# Patient Record
Sex: Female | Born: 1975 | Race: Black or African American | Hispanic: No | Smoking: Never smoker
Health system: Southern US, Community
[De-identification: ages and names within clinical notes are randomized; demographics above are authoritative.]

---

## 2003-10-03 ENCOUNTER — Emergency Department (HOSPITAL_COMMUNITY): Admission: EM | Admit: 2003-10-03 | Discharge: 2003-10-03 | Payer: Self-pay | Admitting: Emergency Medicine

## 2004-02-14 ENCOUNTER — Emergency Department (HOSPITAL_COMMUNITY): Admission: EM | Admit: 2004-02-14 | Discharge: 2004-02-14 | Payer: Self-pay | Admitting: Emergency Medicine

## 2005-02-23 IMAGING — CR DG CHEST 2V
2 series · 2 of 2 positions shown · non-contrast
Comparison: none.

CLINICAL DATA: Cough, upper respiratory infection.
 CHEST, TWO VIEWS

[view not recorded (1 of 2)]
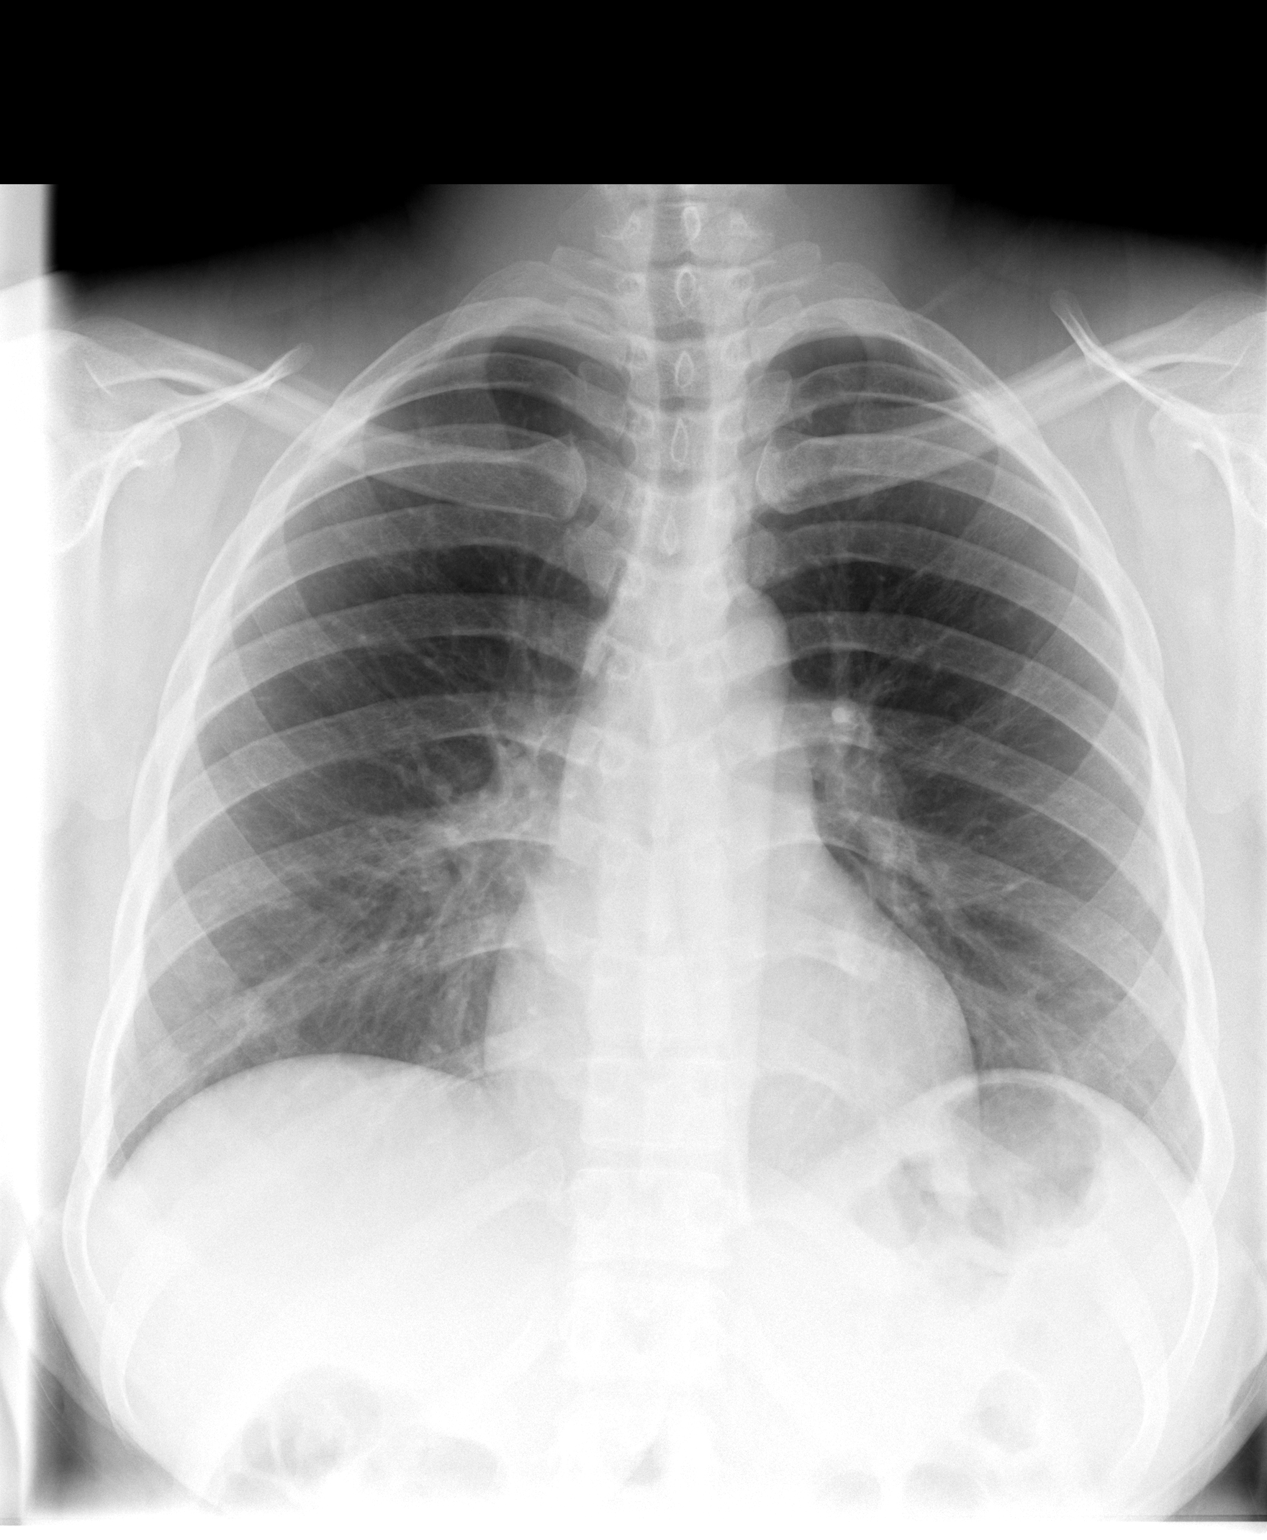

[view not recorded (2 of 2)]
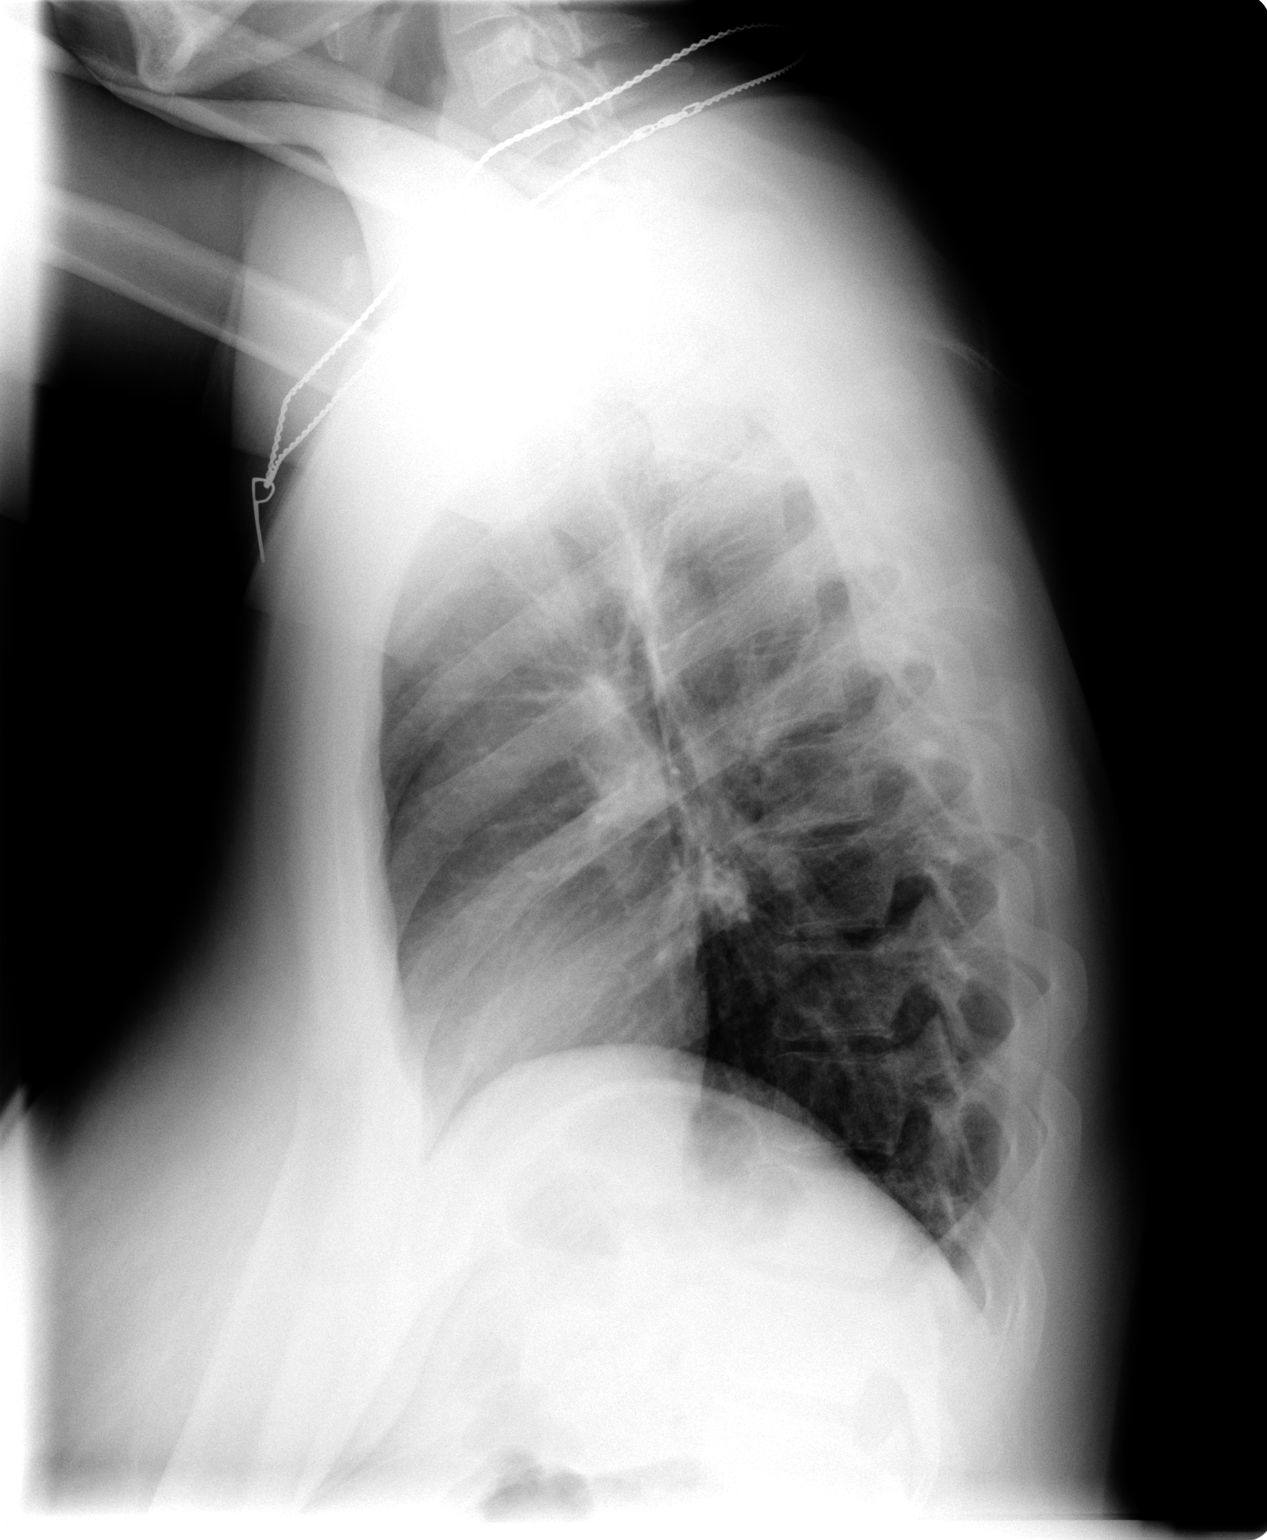

[2 of 2 positions shown; findings below may reference images not displayed]

The cardiomediastinal silhouette is unremarkable. The lungs appear clear.  Mild pectus excavatum sternum deformity is noted. Visualized bony thorax is otherwise intact.
 IMPRESSION 
 No evidence of acute disease.

## 2007-01-27 ENCOUNTER — Emergency Department (HOSPITAL_COMMUNITY): Admission: EM | Admit: 2007-01-27 | Discharge: 2007-01-27 | Payer: Self-pay | Admitting: Emergency Medicine

## 2007-05-19 ENCOUNTER — Emergency Department (HOSPITAL_COMMUNITY): Admission: EM | Admit: 2007-05-19 | Discharge: 2007-05-19 | Payer: Self-pay | Admitting: Emergency Medicine

## 2007-10-09 ENCOUNTER — Other Ambulatory Visit: Admission: RE | Admit: 2007-10-09 | Discharge: 2007-10-09 | Payer: Self-pay | Admitting: Obstetrics and Gynecology

## 2008-01-23 ENCOUNTER — Ambulatory Visit (HOSPITAL_COMMUNITY): Admission: RE | Admit: 2008-01-23 | Discharge: 2008-01-23 | Payer: Self-pay | Admitting: Obstetrics and Gynecology

## 2010-12-13 ENCOUNTER — Encounter: Payer: Self-pay | Admitting: Obstetrics & Gynecology

## 2011-04-06 NOTE — Op Note (Signed)
NAMEMarland Kitchen  April, [name]            ACCOUNT NO.:  [id number]   MEDICAL RECORD NO.:  [id number]          PATIENT TYPE:  AMB   LOCATION:  DAY                           FACILITY:  [facility]   PHYSICIAN:  Tilda Burrow, M.D. DATE OF BIRTH:  06/05/1976   DATE OF PROCEDURE:  01/23/2008  DATE OF DISCHARGE:                               OPERATIVE REPORT   PREOPERATIVE DIAGNOSIS:  Elective sterilization.   POSTOPERATIVE DIAGNOSIS:  Elective sterilization.   PROCEDURE:  Laparoscopic tubal sterilization with Falope rings.   SURGEON:  Tilda Burrow, M.D.   ASSISTANTMarlinda Mike, RN   ANESTHESIA:  General Minerva Areola, CRNA   COMPLICATIONS:  None.   FINDINGS:  Normal tubes and ovaries bilaterally with Falope rings placed  on the proximal third of the tube.   DESCRIPTION OF PROCEDURE:  The patient was taken to the operating room,  prepped and draped in the usual standard fashion with legs in low  lithotomy leg supports after general anesthesia was introduced without  difficulty.  The bladder was in-and-out catheterized and Hulka tenaculum  attached to the cervix for uterine manipulation.  An infraumbilical,  vertical, 1-cm skin incision was made as well as a transverse suprapubic  1-cm incision.  A Veress needle was used to achieve pneumoperitoneum  through the umbilical incision while being careful to orient the needle  toward the pelvis while elevating the abdominal wall by manual  elevation.  Water droplet test was used to confirm intraperitoneal  placement.   Pneumoperitoneum was achieved easily under 8-to-10 mm of intra-abdominal  pressure; and the laparoscopic trocar was introduced, a 5-mm blunt  tipped trocar, under direct visualization using the video camera.  Peritoneal cavity was entered without difficulty.  Inspection of the  anterior surfaces of the abdominal contents showed no evidence of injury  or bleeding.  Attention was directed to the pelvis.  Findings were as  described  above.   Attention was first directed to the left fallopian tube which was  elevated, identified to its fimbriated end and grasped in its midportion  with Falope ring applier.  Falope ring applied and then the tube  infiltrated with Marcaine solution 0.25% using a 22-gauge spinal needle  percutaneously applied.   Attention was then directed to the right fallopian tube where a similar  procedure was performed.  Photo documentation of the ring placements was  performed; 120 cc of saline was instilled into the abdomen; deflation of  CO2 performed; instruments removed and subcuticular 4-0 Dexon closure of  skin incisions performed.  The rest of the surgical instruments were  removed; Steri-Strips placed.  The patient allowed to awaken and go to  recovery room in standard fashion.      Tilda Burrow, M.D.  Electronically Signed     JVF/MEDQ  D:  01/23/2008  T:  01/23/2008  Job:  [id number]

## 2011-08-16 LAB — CBC
HCT: 35 — ABNORMAL LOW
Hemoglobin: 12
MCHC: 34.4
MCV: 85.6
Platelets: 230
RBC: 4.09
RDW: 13.3
WBC: 6.1

## 2011-08-16 LAB — HCG, QUANTITATIVE, PREGNANCY: hCG, Beta Chain, Quant, S: <2

## 2014-04-12 ENCOUNTER — Emergency Department (HOSPITAL_COMMUNITY): Payer: Self-pay

## 2014-04-12 ENCOUNTER — Encounter (HOSPITAL_COMMUNITY): Payer: Self-pay | Admitting: Emergency Medicine

## 2014-04-12 ENCOUNTER — Emergency Department (HOSPITAL_COMMUNITY)
Admission: EM | Admit: 2014-04-12 | Discharge: 2014-04-12 | Disposition: A | Discharge status: Home / Self Care | Payer: Self-pay | Attending: Emergency Medicine | Admitting: Emergency Medicine

## 2014-04-12 DIAGNOSIS — M62838 Other muscle spasm: Secondary | ICD-10-CM | POA: Insufficient documentation

## 2014-04-12 MED ORDER — CYCLOBENZAPRINE HCL 5 MG PO TABS: 5.0000 mg | ORAL_TABLET | Freq: Two times a day (BID) | ORAL | Status: AC | PRN

## 2014-04-12 MED ORDER — HYDROCODONE-ACETAMINOPHEN 5-325 MG PO TABS: 1.0000 | ORAL_TABLET | Freq: Four times a day (QID) | ORAL | Status: AC | PRN

## 2014-04-12 NOTE — Discharge Instructions (Signed)
°  Muscle Cramps and Spasms °Muscle cramps and spasms occur when a muscle or muscles tighten and you have no control over this tightening (involuntary muscle contraction). They are a common problem and can develop in any muscle. The most common place is in the calf muscles of the leg. Both muscle cramps and muscle spasms are involuntary muscle contractions, but they also have differences:  °· Muscle cramps are sporadic and painful. They may last a few seconds to a quarter of an hour. Muscle cramps are often more forceful and last longer than muscle spasms. °· Muscle spasms may or may not be painful. They may also last just a few seconds or much longer. °CAUSES  °It is uncommon for cramps or spasms to be due to a serious underlying problem. In many cases, the cause of cramps or spasms is unknown. Some common causes are:  °· Overexertion.   °· Overuse from repetitive motions (doing the same thing over and over).   °· Remaining in a certain position for a long period of time.   °· Improper preparation, form, or technique while performing a sport or activity.   °· Dehydration.   °· Injury.   °· Side effects of some medicines.   °· Abnormally low levels of the salts and ions in your blood (electrolytes), especially potassium and calcium. This could happen if you are taking water pills (diuretics) or you are pregnant.   °Some underlying medical problems can make it more likely to develop cramps or spasms. These include, but are not limited to:  °· Diabetes.   °· Parkinson disease.   °· Hormone disorders, such as thyroid problems.   °· Alcohol abuse.   °· Diseases specific to muscles, joints, and bones.   °· Blood vessel disease where not enough blood is getting to the muscles.   °HOME CARE INSTRUCTIONS  °· Stay well hydrated. Drink enough water and fluids to keep your urine clear or pale yellow. °· It may be helpful to massage, stretch, and relax the affected muscle. °· For tight or tense muscles, use a warm towel, heating  pad, or hot shower water directed to the affected area. °· If you are sore or have pain after a cramp or spasm, applying ice to the affected area may relieve discomfort. °· Put ice in a plastic bag. °· Place a towel between your skin and the bag. °· Leave the ice on for 15-20 minutes, 03-04 times a day. °· Medicines used to treat a known cause of cramps or spasms may help reduce their frequency or severity. Only take over-the-counter or prescription medicines as directed by your caregiver. °SEEK MEDICAL CARE IF:  °Your cramps or spasms get more severe, more frequent, or do not improve over time.  °MAKE SURE YOU:  °· Understand these instructions. °· Will watch your condition. °· Will get help right away if you are not doing well or get worse. °Document Released: 04/30/2002 Document Revised: 03/05/2013 Document Reviewed: 10/25/2012 °[employer]® Patient Information ©2014 [employer]. ° ° °

## 2014-04-12 NOTE — ED Provider Notes (Signed)
Medical screening examination/treatment/procedure(s) were performed by non-physician practitioner and as supervising physician I was immediately available for consultation/collaboration.   Toy Baker, MD 04/12/14 2233

## 2014-04-12 NOTE — ED Notes (Signed)
Pt c/o left neck and shoulder pain, states she woke up 2 days ago with neck pain that didn't go away. Now c/o shoulder pain and it hurts to lift left arm up.

## 2014-04-12 NOTE — ED Provider Notes (Signed)
CSN: [id number]     Arrival date & time 04/12/14  1813 History  This chart was scribed for non-physician practitioner, Teressa LowerVrinda Hulan Szumski, NP-C working with Toy BakerAnthony T Allen, MD by Luisa DagoPriscilla Tutu, ED scribe. This patient was seen in room WTR7/WTR7 and the patient's care was started at 6:38 PM.    Chief Complaint  Patient presents with  . Shoulder Pain  . Neck Pain   The history is provided by the patient. No language interpreter was used.   HPI Comments: April [name] M Lawrence is a 38 y.o. female who presents to the Emergency Department complaining of worsening left sided neck pain that started approximately 2 days ago upon waking. Pt is also complaining of associated left shoulder pain. April [name] states that the shoulder pain is worsened by the movement of his left arm, especially when April [name] tries to lift it above his head. April [name] denies any recent falls or injuries. Pt denies any pertinent medical history.  No past medical history on file. No past surgical history on file. No family history on file. History  Substance Use Topics  . Smoking status: Not on file  . Smokeless tobacco: Not on file  . Alcohol Use: Not on file   OB History   No data available     Review of Systems  Constitutional: Negative for fever, chills and fatigue.  HENT: Negative for congestion and sneezing.   Respiratory: Negative for cough.   Cardiovascular: Negative for chest pain.  Gastrointestinal: Negative for nausea, vomiting, abdominal pain and diarrhea.  Musculoskeletal: Positive for arthralgias and neck pain.  Skin: Negative for rash.  Neurological: Negative for headaches.      Allergies  Review of patient's allergies indicates not on file.  Home Medications   Prior to Admission medications   Not on File   BP 131/77  Pulse 70  Temp(Src) 98.4 F (36.9 C) (Oral)  Resp 18  SpO2 100%  Physical Exam  Nursing note and vitals reviewed. Constitutional: April [name] appears well-developed and well-nourished. No  distress.  HENT:  Head: Normocephalic and atraumatic.  Eyes: Conjunctivae are normal. Right eye exhibits no discharge. Left eye exhibits no discharge.  Neck: Neck supple.  Cardiovascular: Normal rate, regular rhythm and normal heart sounds.  Exam reveals no gallop and no friction rub.   No murmur heard. Pulmonary/Chest: Effort normal and breath sounds normal. No respiratory distress.  Abdominal: Soft. April [name] exhibits no distension. There is no tenderness.  Musculoskeletal: April [name] exhibits tenderness. April [name] exhibits no edema.  Decreased rom of the left shoulder. No pain with palpation. Pain with palpation in the right rhomboid. Decreased rom of the neck  Neurological: April [name] is alert. April [name] exhibits normal muscle tone. Coordination normal.  Grip strength equal  Skin: Skin is warm and dry.  Psychiatric: April [name] has a normal mood and affect. Her behavior is normal. Thought content normal.    ED Course  Procedures (including critical care time)  DIAGNOSTIC STUDIES: Oxygen Saturation is 100% on RA, normal by my interpretation.    COORDINATION OF CARE: 6:41 PM- Will order X-Ray of the neck. Pt advised of plan for treatment and pt agrees.  Labs Review Labs Reviewed - No data to display  Imaging Review Dg Cervical Spine Complete  04/12/2014   CLINICAL DATA:  Shoulder pain and neck pain.  EXAM: CERVICAL SPINE  4+ VIEWS  COMPARISON:  None.  FINDINGS: There is no evidence of cervical spine fracture or prevertebral soft tissue swelling. Alignment is normal. Disc spaces are maintained. Neural foramina are  widely patent bilaterally. No other significant bone abnormalities are identified. Trachea is midline. Lung apices are clear.  IMPRESSION: No acute bony abnormality for significant degenerative change.   Electronically Signed   By: Britta Mccreedy M.D.   On: 04/12/2014 19:18     EKG Interpretation None      MDM   Final diagnoses:  Muscle spasm    Neurologically intact. No deficits. Will treat  symptomatically and have follow up for continued symptoms  I personally performed the services described in this documentation, which was scribed in my presence. The recorded information has been reviewed and is accurate.     Teressa Lower, NP 04/12/14 1928

## 2015-04-22 IMAGING — CR DG CERVICAL SPINE COMPLETE 4+V
5 series · 5 of 5 positions shown · non-contrast
Comparison: None.

CLINICAL DATA: Shoulder pain and neck pain.

EXAM:
CERVICAL SPINE  4+ VIEWS

[w cervical spine lat]
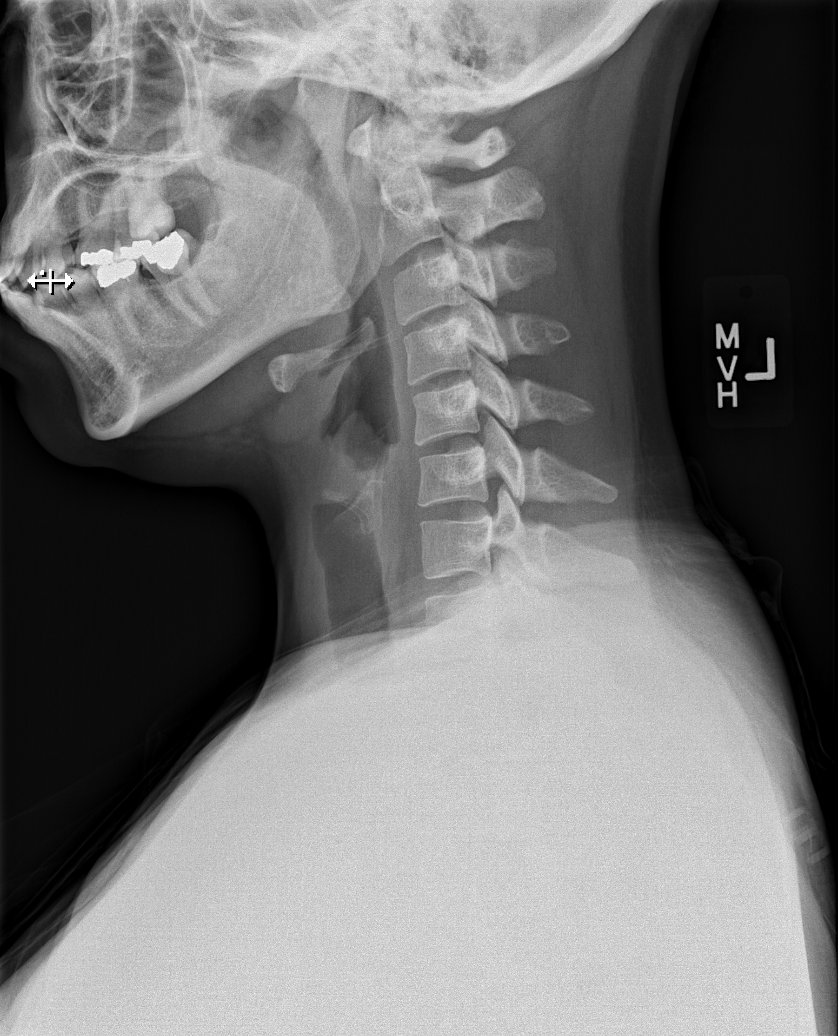

[w cervical spine ap_obl (1 of 2)]
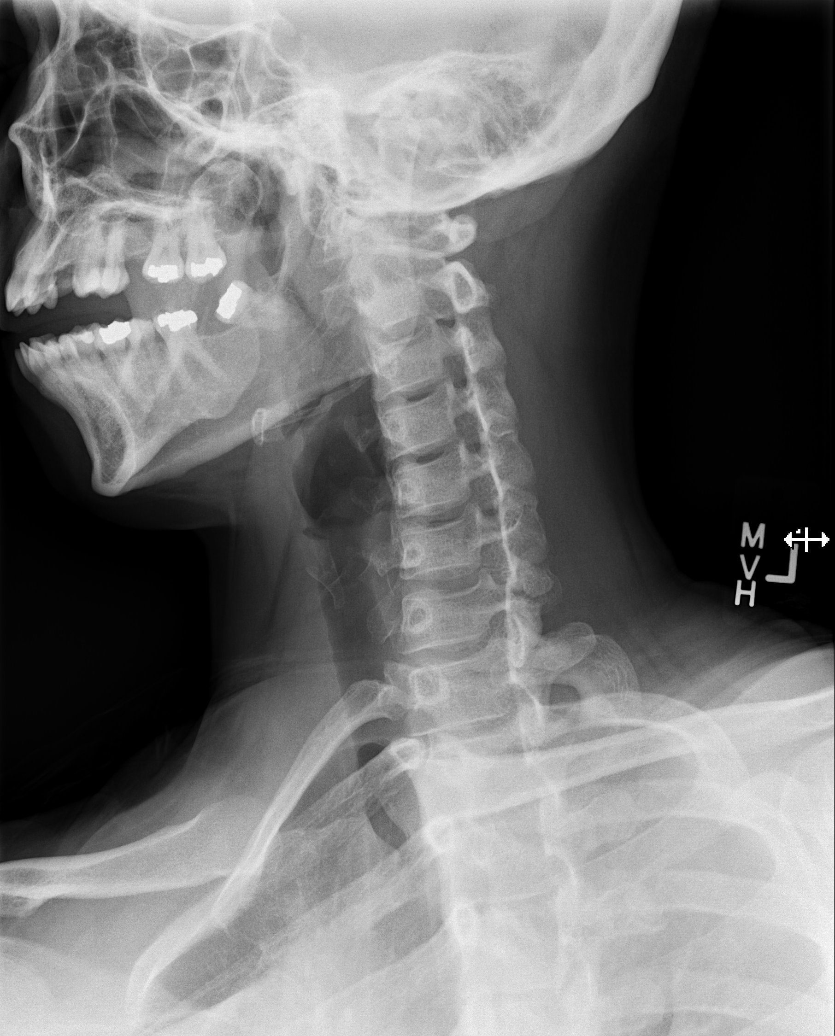

[w cervical spine ap_obl (2 of 2)]
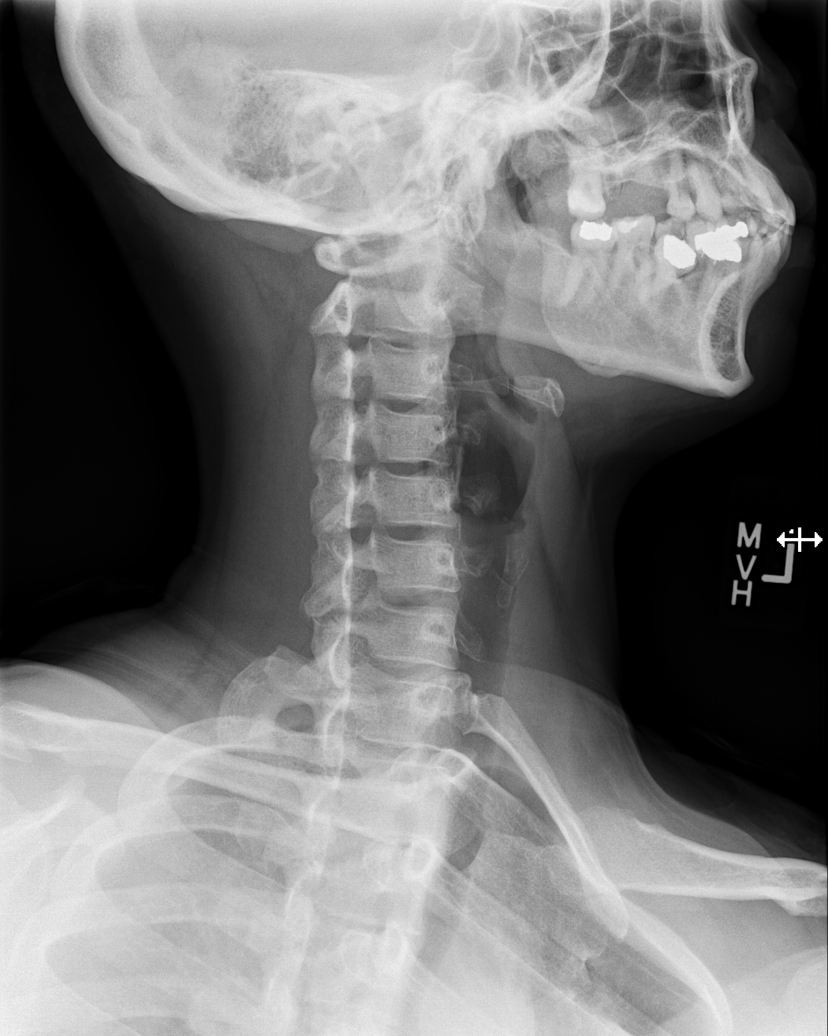

[w cervical spine ap]
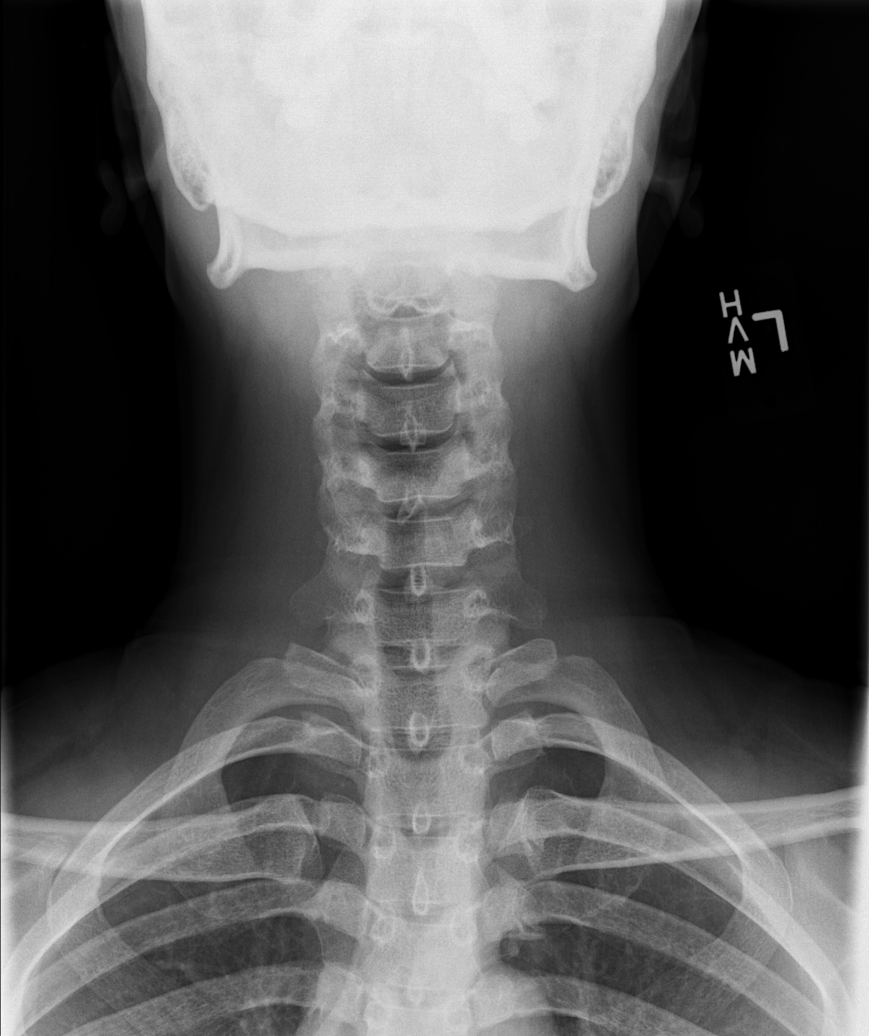

[w cervical spine odontoid]
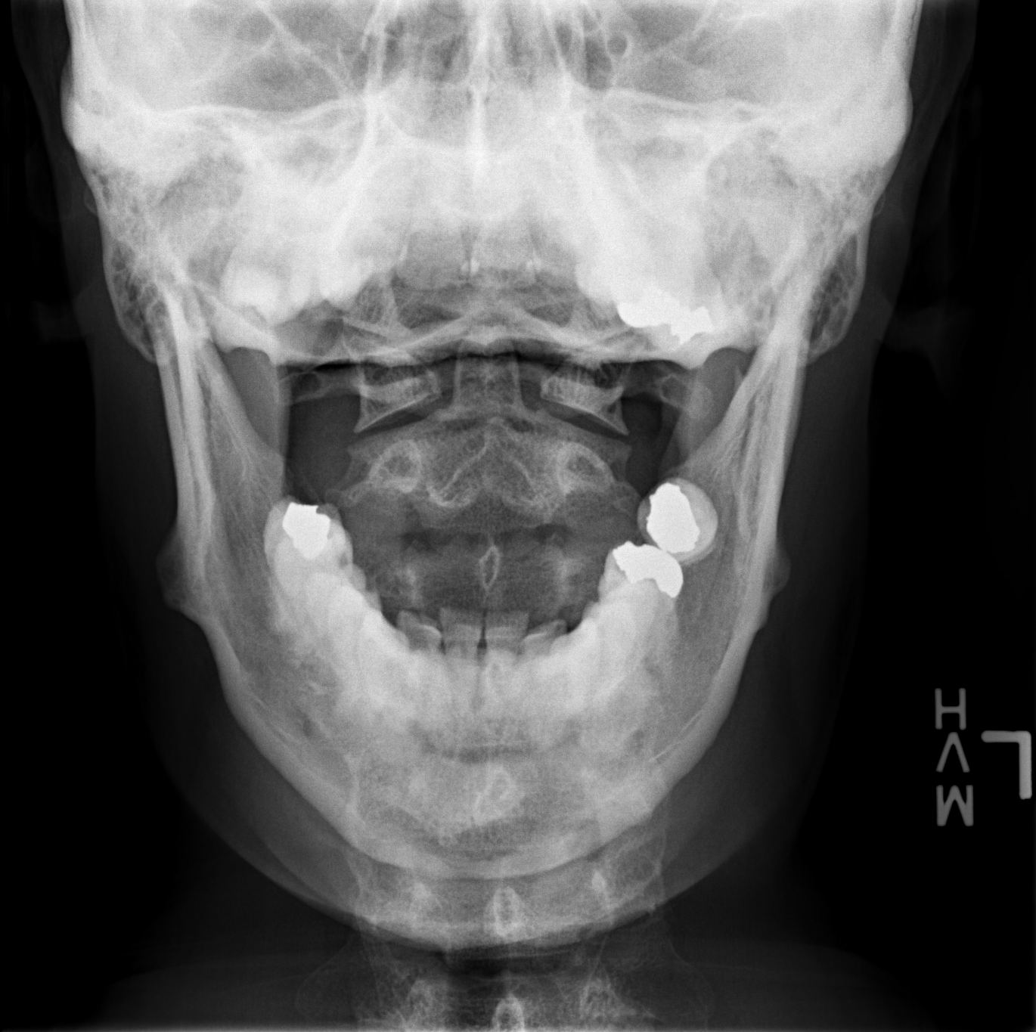

[5 of 5 positions shown; findings below may reference images not displayed]

FINDINGS: There is no evidence of cervical spine fracture or prevertebral soft
tissue swelling. Alignment is normal. Disc spaces are maintained.
Neural foramina are widely patent bilaterally. No other significant
bone abnormalities are identified. Trachea is midline. Lung apices
are clear.
IMPRESSION: No acute bony abnormality for significant degenerative change.
